# Patient Record
Sex: Male | Born: 1975 | Race: White | Hispanic: No | Smoking: Former smoker
Health system: Southern US, Community
[De-identification: ages and names within clinical notes are randomized; demographics above are authoritative.]

---

## 2016-05-11 DIAGNOSIS — R0683 Snoring: Secondary | ICD-10-CM | POA: Diagnosis not present

## 2016-05-11 DIAGNOSIS — R5382 Chronic fatigue, unspecified: Secondary | ICD-10-CM | POA: Diagnosis not present

## 2016-05-11 DIAGNOSIS — R61 Generalized hyperhidrosis: Secondary | ICD-10-CM | POA: Diagnosis not present

## 2016-05-11 DIAGNOSIS — M791 Myalgia: Secondary | ICD-10-CM | POA: Diagnosis not present

## 2016-07-12 DIAGNOSIS — G4733 Obstructive sleep apnea (adult) (pediatric): Secondary | ICD-10-CM | POA: Diagnosis not present

## 2016-07-13 DIAGNOSIS — G4733 Obstructive sleep apnea (adult) (pediatric): Secondary | ICD-10-CM | POA: Diagnosis not present

## 2018-05-15 ENCOUNTER — Other Ambulatory Visit: Payer: Self-pay

## 2018-05-15 ENCOUNTER — Other Ambulatory Visit: Payer: Self-pay | Admitting: Family Medicine

## 2018-05-15 ENCOUNTER — Ambulatory Visit
Admission: RE | Admit: 2018-05-15 | Discharge: 2018-05-15 | Disposition: A | Discharge status: Home / Self Care | Payer: 59 | Source: Ambulatory Visit | Attending: Family Medicine | Admitting: Family Medicine

## 2018-05-15 DIAGNOSIS — R0781 Pleurodynia: Secondary | ICD-10-CM

## 2018-08-16 ENCOUNTER — Encounter: Payer: Self-pay | Admitting: Family Medicine

## 2018-10-02 ENCOUNTER — Emergency Department (HOSPITAL_COMMUNITY): Payer: 59

## 2018-10-02 ENCOUNTER — Emergency Department (HOSPITAL_COMMUNITY)
Admission: EM | Admit: 2018-10-02 | Discharge: 2018-10-02 | Disposition: A | Discharge status: Home / Self Care | Payer: 59 | Attending: Emergency Medicine | Admitting: Emergency Medicine

## 2018-10-02 ENCOUNTER — Encounter (HOSPITAL_COMMUNITY): Payer: Self-pay | Admitting: Emergency Medicine

## 2018-10-02 ENCOUNTER — Other Ambulatory Visit: Payer: Self-pay

## 2018-10-02 DIAGNOSIS — N200 Calculus of kidney: Secondary | ICD-10-CM | POA: Insufficient documentation

## 2018-10-02 DIAGNOSIS — R1031 Right lower quadrant pain: Secondary | ICD-10-CM | POA: Diagnosis present

## 2018-10-02 DIAGNOSIS — Z79899 Other long term (current) drug therapy: Secondary | ICD-10-CM | POA: Insufficient documentation

## 2018-10-02 LAB — URINALYSIS, ROUTINE W REFLEX MICROSCOPIC
Bacteria, UA: NONE SEEN
Bilirubin Urine: NEGATIVE
Glucose, UA: NEGATIVE mg/dL
Ketones, ur: 20 mg/dL — AB
Leukocytes,Ua: NEGATIVE
Nitrite: NEGATIVE
Protein, ur: 30 mg/dL — AB
Specific Gravity, Urine: 1.024 (ref 1.005–1.030)
pH: 8 (ref 5.0–8.0)

## 2018-10-02 LAB — CBC
HCT: 45 % (ref 39.0–52.0)
Hemoglobin: 15.2 g/dL (ref 13.0–17.0)
MCH: 29.8 pg (ref 26.0–34.0)
MCHC: 33.8 g/dL (ref 30.0–36.0)
MCV: 88.2 fL (ref 80.0–100.0)
Platelets: 229 10*3/uL (ref 150–400)
RBC: 5.1 MIL/uL (ref 4.22–5.81)
RDW: 12.6 % (ref 11.5–15.5)
WBC: 12 10*3/uL — ABNORMAL HIGH (ref 4.0–10.5)
nRBC: 0 % (ref 0.0–0.2)

## 2018-10-02 LAB — COMPREHENSIVE METABOLIC PANEL
ALT: 37 U/L (ref 0–44)
AST: 29 U/L (ref 15–41)
Albumin: 5 g/dL (ref 3.5–5.0)
Alkaline Phosphatase: 53 U/L (ref 38–126)
Anion gap: 13 (ref 5–15)
BUN: 15 mg/dL (ref 6–20)
CO2: 24 mmol/L (ref 22–32)
Calcium: 9.7 mg/dL (ref 8.9–10.3)
Chloride: 102 mmol/L (ref 98–111)
Creatinine, Ser: 0.99 mg/dL (ref 0.61–1.24)
GFR calc Af Amer: >60 mL/min (ref >60)
GFR calc non Af Amer: >60 mL/min (ref >60)
Glucose, Bld: 119 mg/dL — ABNORMAL HIGH (ref 70–99)
Potassium: 3.9 mmol/L (ref 3.5–5.1)
Sodium: 139 mmol/L (ref 135–145)
Total Bilirubin: 0.8 mg/dL (ref 0.3–1.2)
Total Protein: 7.8 g/dL (ref 6.5–8.1)

## 2018-10-02 LAB — LIPASE, BLOOD: Lipase: 30 U/L (ref 11–51)

## 2018-10-02 MED ORDER — KETOROLAC TROMETHAMINE 30 MG/ML IJ SOLN
30.0000 mg | Freq: Once | INTRAMUSCULAR | Status: AC
  Administered 2018-10-02: 20:00:00 30 mg via INTRAVENOUS
  Filled 2018-10-02: qty 1

## 2018-10-02 MED ORDER — OXYCODONE-ACETAMINOPHEN 5-325 MG PO TABS: 1.0000 | ORAL_TABLET | ORAL | 0 refills | Status: AC | PRN

## 2018-10-02 MED ORDER — SODIUM CHLORIDE (PF) 0.9 % IJ SOLN
INTRAMUSCULAR | Status: AC
  Filled 2018-10-02: qty 50

## 2018-10-02 MED ORDER — MORPHINE SULFATE (PF) 4 MG/ML IV SOLN
4.0000 mg | Freq: Once | INTRAVENOUS | Status: DC
  Filled 2018-10-02: qty 1

## 2018-10-02 MED ORDER — IOHEXOL 300 MG/ML  SOLN
100.0000 mL | Freq: Once | INTRAMUSCULAR | Status: AC | PRN
  Administered 2018-10-02: 19:00:00 100 mL via INTRAVENOUS

## 2018-10-02 MED ORDER — SODIUM CHLORIDE 0.9 % IV BOLUS
1000.0000 mL | Freq: Once | INTRAVENOUS | Status: AC
  Administered 2018-10-02: 20:00:00 1000 mL via INTRAVENOUS

## 2018-10-02 MED ORDER — ONDANSETRON HCL 4 MG/2ML IJ SOLN
4.0000 mg | Freq: Once | INTRAMUSCULAR | Status: DC
  Filled 2018-10-02: qty 2

## 2018-10-02 NOTE — ED Triage Notes (Signed)
Per pt, states lower right back pain for a couple of days-states intense RLQ pain that started 45 minutes ago-patient thinks it is his appendix

## 2018-10-02 NOTE — Discharge Instructions (Signed)
We discussed the results of your CT imaging.  Your urine analysis was negative.  I prescribed a short course of pain medication to help with your pain, please take for severe pain.  Please follow up with PCP for further evaluation.

## 2018-10-02 NOTE — ED Provider Notes (Signed)
[facility]-EMERGENCY DEPT Provider Note   CSN: [id number] Arrival date & time: 10/02/18  1701     History   Chief Complaint Chief Complaint  Patient presents with  . Abdominal Pain    HPI Victor Morrow is a 43 y.o. male.     43 y.o male with a PMH of HTN not currently on medication due to weight loss presents to the ED with a chief complaint of abdominal pain x 1 hour.  Patient reports he began experiencing sudden onset of right lower quadrant pain which she describes as an intermittent "I feel like I am being shot in the stomach ", states his pain came on suddenly which has also caused him to be short of breath, and nauseated.  Patient also reports he has not had anything to eat since last night, reports some anorexia.  He denies any vomiting, diarrhea, urinary symptoms, testicular swelling.  Denies any fevers, chest pain, shortness of breath.  Of note, patient had traveled to [place] this past week via car to visit his mother.  The history is provided by the patient and medical records.  Abdominal Pain Pain location:  RLQ Pain quality: shooting and stabbing   Associated symptoms: nausea   Associated symptoms: no chest pain, no chills, no cough, no diarrhea, no dysuria, no fever, no hematuria, no shortness of breath, no sore throat and no vomiting     History reviewed. No pertinent past medical history.  There are no active problems to display for this patient.   History reviewed. No pertinent surgical history.      Home Medications    Prior to Admission medications   Medication Sig Start Date End Date Taking? Authorizing Provider  APPLE CIDER VINEGAR PO Take 1 Dose by mouth daily.    Yes [provider]  ibuprofen (ADVIL) 200 MG tablet Take 600 mg by mouth every 6 (six) hours as needed for moderate pain.   Yes [provider]  Multiple Vitamin (MULTIVITAMIN WITH MINERALS) TABS tablet Take 1 tablet by mouth daily.   Yes  [provider]  oxyCODONE-acetaminophen (PERCOCET/ROXICET) 5-325 MG tablet Take 1 tablet by mouth every 4 (four) hours as needed for up to 3 days for severe pain. 10/02/18 10/05/18  Claude Manges, PA-C    Family History No family history on file.  Social History Social History   Tobacco Use  . Smoking status: Not on file  Substance Use Topics  . Alcohol use: Not on file  . Drug use: Not on file     Allergies   Patient has no known allergies.   Review of Systems Review of Systems  Constitutional: Negative for chills and fever.  HENT: Negative for ear pain and sore throat.   Eyes: Negative for pain and visual disturbance.  Respiratory: Negative for cough and shortness of breath.   Cardiovascular: Negative for chest pain and palpitations.  Gastrointestinal: Positive for abdominal pain and nausea. Negative for diarrhea and vomiting.  Genitourinary: Negative for dysuria, flank pain, hematuria, penile swelling, scrotal swelling and testicular pain.  Musculoskeletal: Negative for arthralgias and back pain.  Skin: Negative for color change and rash.  Neurological: Negative for seizures and syncope.  All other systems reviewed and are negative.    Physical Exam Updated Vital Signs BP (!) 161/105 (BP Location: Right Arm)   Pulse 94   Temp 97.7 F (36.5 C) (Oral)   Resp 16   Ht 6\' 4"  (1.93 m)   Wt 105.7  kg   SpO2 100%   BMI 28.36 kg/m   Physical Exam Vitals signs and nursing note reviewed.  Constitutional:      General: He is in acute distress.     Appearance: He is well-developed. He is ill-appearing.  HENT:     Head: Normocephalic and atraumatic.  Cardiovascular:     Rate and Rhythm: Normal rate and regular rhythm.  Pulmonary:     Effort: Pulmonary effort is normal.     Breath sounds: Normal breath sounds. No wheezing, rhonchi or rales.  Abdominal:     General: Bowel sounds are absent. There is no distension or abdominal bruit. There are no signs of  injury.     Tenderness: There is abdominal tenderness in the right lower quadrant and suprapubic area. There is guarding. There is no right CVA tenderness, left CVA tenderness or rebound.     Hernia: No hernia is present.  Skin:    General: Skin is warm and dry.  Neurological:     Mental Status: He is alert and oriented to person, place, and time.      ED Treatments / Results  Labs (all labs ordered are listed, but only abnormal results are displayed) Labs Reviewed  COMPREHENSIVE METABOLIC PANEL - Abnormal; Notable for the following components:      Result Value   Glucose, Bld 119 (*)    All other components within normal limits  CBC - Abnormal; Notable for the following components:   WBC 12.0 (*)    All other components within normal limits  URINALYSIS, ROUTINE W REFLEX MICROSCOPIC - Abnormal; Notable for the following components:   Hgb urine dipstick SMALL (*)    Ketones, ur 20 (*)    Protein, ur 30 (*)    All other components within normal limits  LIPASE, BLOOD    EKG None  Radiology Ct Abdomen Pelvis W Contrast  Result Date: 10/02/2018 CLINICAL DATA:  43 year old male with abdominal pain. Concern for acute appendicitis. EXAM: CT ABDOMEN AND PELVIS WITH CONTRAST TECHNIQUE: Multidetector CT imaging of the abdomen and pelvis was performed using the standard protocol following bolus administration of intravenous contrast. CONTRAST:  100mL OMNIPAQUE IOHEXOL 300 MG/ML  SOLN COMPARISON:  None. FINDINGS: Lower chest: The visualized lung bases are clear. No intra-abdominal free air or free fluid. Hepatobiliary: No focal liver abnormality is seen. No gallstones, gallbladder wall thickening, or biliary dilatation. Pancreas: Unremarkable. No pancreatic ductal dilatation or surrounding inflammatory changes. Spleen: Normal in size without focal abnormality. Adrenals/Urinary Tract: The adrenal glands are unremarkable. There is a punctate stone in the distal right ureter close to the  ureterovesical junction (series 5, image 94 and axial series 2, image 75). There is mild fullness of the right renal collecting system and haziness of the right perinephric and periureteric fat. The left kidney is unremarkable. Slight delayed enhancement of the right renal parenchyma. The visualized ureters and urinary bladder appear unremarkable. Stomach/Bowel: There is sigmoid diverticulosis without active inflammatory changes. There is no bowel obstruction or active inflammation. The appendix is normal. Vascular/Lymphatic: The abdominal aorta and IVC are unremarkable. No portal venous gas. There is no adenopathy. Reproductive: The prostate and seminal vesicles are grossly unremarkable. No pelvic mass Other: None Musculoskeletal: No acute or significant osseous findings. IMPRESSION: 1. Punctate distal right ureteral/right UVJ calculus with mild right hydronephrosis. Correlation with urinalysis recommended to exclude superimposed UTI. 2. Sigmoid diverticulosis. No bowel obstruction or active inflammation. Normal appendix. Electronically Signed   By: Ceasar MonsArash  Radparvar M.D.  On: 10/02/2018 19:32    Procedures Procedures (including critical care time)  Medications Ordered in ED Medications  ondansetron (ZOFRAN) injection 4 mg (4 mg Intravenous Not Given 10/02/18 2021)  sodium chloride (PF) 0.9 % injection (has no administration in time range)  sodium chloride 0.9 % bolus 1,000 mL (1,000 mLs Intravenous New Bag/Given 10/02/18 1933)  iohexol (OMNIPAQUE) 300 MG/ML solution 100 mL (100 mLs Intravenous Contrast Given 10/02/18 1908)  ketorolac (TORADOL) 30 MG/ML injection 30 mg (30 mg Intravenous Given 10/02/18 2020)     Initial Impression / Assessment and Plan / ED Course  I have reviewed the triage vital signs and the nursing notes.  Pertinent labs & imaging results that were available during my care of the patient were reviewed by me and considered in my medical decision making (see chart for details).     Patient with no past medical history presents the ED with sudden onset of right lower quadrant pain which began an hour ago.  Described as a shooting sensation located on his right side.  He also endorses anorexia, nausea, has not had any vomiting.  He reports his pain is intermittent and once it arrives it makes him short of breath.  He has not taken any medication for improvement in symptoms.  Had a regular bowel movement yesterday, no blood in his stool.  Recently traveled out of state via car, denies any sick contacts.  He denies any fevers. He denies any testicular swelling, testicular pain, urinary symptoms such as dysuria, hematuria.   During primary evaluation patient appears uncomfortable, he reports laying on his left side makes his pain better along with holding of his right lower quadrant.  He is afebrile on arrival, vital signs are within normal limits aside from slight elevated blood pressure suspect this is due to pain.  Differential diagnosis included but not limited to appendicitis, nephrolithiasis, testicular torsion.  We will provide patient with a liter of fluids, lab screening, Zofran along with morphine to help with his symptoms along with CT evaluation. CT abdomen showed: 1. Punctate distal right ureteral/right UVJ calculus with mild right  hydronephrosis. Correlation with urinalysis recommended to exclude  superimposed UTI.  2. Sigmoid diverticulosis. No bowel obstruction or active  inflammation. Normal appendix.     These results were discussed with patient, UA did not show any white blood cell count, leukocytes, nitrites. Doubt infected stone.  Will now treat patient symptomatically with some pain control while at home.  He was offered morphine for pain but he declined this, gave him Toradol instead which he tolerated well.  Patient is otherwise afebrile, ambulatory with a steady gait.  Discussed strict return precautions with patient.  Patient stable for discharge.   Portions of this note were generated with [employer]. Dictation errors may occur despite best attempts at proofreading.  Final Clinical Impressions(s) / ED Diagnoses   Final diagnoses:  Right lower quadrant abdominal pain  Kidney stone on right side    ED Discharge Orders         Ordered    oxyCODONE-acetaminophen (PERCOCET/ROXICET) 5-325 MG tablet  Every 4 hours PRN     10/02/18 2100           Janeece Fitting, PA-C 10/02/18 2113    Virgel Manifold, MD 10/06/18 1245

## 2018-12-12 ENCOUNTER — Ambulatory Visit: Payer: Self-pay | Admitting: Podiatry

## 2018-12-13 ENCOUNTER — Ambulatory Visit (INDEPENDENT_AMBULATORY_CARE_PROVIDER_SITE_OTHER): Payer: 59

## 2018-12-13 ENCOUNTER — Other Ambulatory Visit: Payer: Self-pay

## 2018-12-13 ENCOUNTER — Encounter: Payer: Self-pay | Admitting: Podiatry

## 2018-12-13 ENCOUNTER — Ambulatory Visit: Payer: 59 | Admitting: Podiatry

## 2018-12-13 VITALS — BP 126/89

## 2018-12-13 DIAGNOSIS — M2142 Flat foot [pes planus] (acquired), left foot: Secondary | ICD-10-CM

## 2018-12-13 DIAGNOSIS — M2141 Flat foot [pes planus] (acquired), right foot: Secondary | ICD-10-CM

## 2018-12-13 DIAGNOSIS — M21621 Bunionette of right foot: Secondary | ICD-10-CM | POA: Diagnosis not present

## 2018-12-13 DIAGNOSIS — M205X1 Other deformities of toe(s) (acquired), right foot: Secondary | ICD-10-CM | POA: Diagnosis not present

## 2018-12-17 ENCOUNTER — Encounter: Payer: Self-pay | Admitting: Podiatry

## 2018-12-17 NOTE — Progress Notes (Signed)
Subjective:  Patient ID: Victor Morrow, male    DOB: [birth year],  MRN: [id number]  Chief Complaint  Patient presents with  . Foot Pain    pt is here for pes planus bil, pt is also here possible callouses as well    43 y.o. male presents with the above complaint.  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  Patient states that they have been painful for over a year now he has tried over-the-counter orthotics but has not helped.  Patient states he is also has corns and calluses around the tailor's bunion from excessive pressure from shoe gear.  He has not tried any other shoe gear modification.  He has not seek any treatment from any other podiatrist.  He denies any other acute complaints.   Review of Systems: Negative except as noted in the HPI. Denies N/V/F/Ch.  No past medical history on file.  Current Outpatient Medications:  .  APPLE CIDER VINEGAR PO, Take 1 Dose by mouth daily. , Disp: , Rfl:  .  ibuprofen (ADVIL) 200 MG tablet, Take 600 mg by mouth every 6 (six) hours as needed for moderate pain., Disp: , Rfl:  .  Multiple Vitamin (MULTIVITAMIN WITH MINERALS) TABS tablet, Take 1 tablet by mouth daily., Disp: , Rfl:   Social History   Tobacco Use  Smoking Status Former Smoker  Smokeless Tobacco Former User    No Known Allergies Objective:   Vitals:   12/13/18 0852  BP: 126/89   There is no height or weight on file to calculate BMI. Constitutional Well developed. Well nourished.  Vascular Dorsalis pedis pulses palpable bilaterally. Posterior tibial pulses palpable bilaterally. Capillary refill normal to all digits.  No cyanosis or clubbing noted. Pedal hair growth normal.  Neurologic Normal speech. Oriented to person, place, and time. Epicritic sensation to light touch grossly present bilaterally.  Dermatologic Nails well  groomed and normal in appearance. No open wounds. No skin lesions.  Orthopedic:  Pain on palpation to the right fifth metatarsal phalangeal joint.  There is a hyperkeratotic lesion noted at the fifth metatarsal head.  Pain on palpation to the hyperkeratotic lesion.  There is an adductovarus of the fifth digit noted.  No callus formation on the fourth digit.    Radiographs: 2 views of skeletally mature adult bilateral feet: There is slight increase in the IM angle with the right being slightly more than the left.  There is an increase in lateral deviation angle bilaterally.  There is a increase in soft tissue density and volume around the fifth metatarsophalangeal joint.  There is a adductovarus deformity noted to bilateral fifth digit. Assessment:   1. Pes planus of both feet    Plan:  Patient was evaluated and treated and all questions answered.  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  I explained to the patient that debridement of the hyperkeratotic lesion is the best way to keep it at [place] conservatively. -I also explained to the patient surgical intervention to fix the tailor's bunion as well as the hammertoe/adductovarus component of the fifth digit.  This in theory will reduce the pressure on the outside of the fifth metatarsal head and therefore reducing pain.  Patient states that  he has tried and exhausted all the over-the-counter therapy as well as shoe gear modification.  Patient wishes that he would like to have a surgical intervention however he would like to discuss it further after he checks with the insurance company. -I explained to the patient to come see me back when he is ready to undergo surgery to help realign the fifth digit as well as address the tailor's bunion. -Patient is scheduled to see rick  for orthotics to help with addressing the hindfoot motion and therefore improving the gait.  I believe this will also help in the reduction of pain.  Return in about 3 weeks (around 01/03/2019).

## 2019-01-08 ENCOUNTER — Ambulatory Visit: Payer: 59 | Admitting: Podiatry

## 2019-01-09 ENCOUNTER — Other Ambulatory Visit: Payer: 59 | Admitting: Orthotics

## 2019-01-13 ENCOUNTER — Other Ambulatory Visit: Payer: Self-pay

## 2019-01-13 ENCOUNTER — Ambulatory Visit: Payer: 59 | Admitting: Orthotics

## 2019-01-13 DIAGNOSIS — M2141 Flat foot [pes planus] (acquired), right foot: Secondary | ICD-10-CM

## 2019-01-13 DIAGNOSIS — M21621 Bunionette of right foot: Secondary | ICD-10-CM

## 2019-01-13 DIAGNOSIS — M205X1 Other deformities of toe(s) (acquired), right foot: Secondary | ICD-10-CM

## 2019-01-13 NOTE — Progress Notes (Signed)
Patient decided to pass on f/o at this time to address tailor bunion issues; he really needs a wider shoe to address the pain on lateral aspect of 5th toe.

## 2020-11-25 DIAGNOSIS — I10 Essential (primary) hypertension: Secondary | ICD-10-CM | POA: Diagnosis not present

## 2020-11-25 DIAGNOSIS — Z23 Encounter for immunization: Secondary | ICD-10-CM | POA: Diagnosis not present

## 2020-11-25 DIAGNOSIS — F419 Anxiety disorder, unspecified: Secondary | ICD-10-CM | POA: Diagnosis not present

## 2020-11-25 DIAGNOSIS — Z125 Encounter for screening for malignant neoplasm of prostate: Secondary | ICD-10-CM | POA: Diagnosis not present

## 2020-11-25 DIAGNOSIS — Z1211 Encounter for screening for malignant neoplasm of colon: Secondary | ICD-10-CM | POA: Diagnosis not present

## 2021-01-12 IMAGING — CT CT ABD-PELV W/ CM
2 of 5 series · 16 of 46 positions shown, 18 images · IV contrast (omnipaque)
Comparison: None.

CLINICAL DATA: 43-year-old male with abdominal pain. Concern for
acute appendicitis.

EXAM:
CT ABDOMEN AND PELVIS WITH CONTRAST
TECHNIQUE: Multidetector CT imaging of the abdomen and pelvis was performed
using the standard protocol following bolus administration of
intravenous contrast.
CONTRAST:  100mL OMNIPAQUE IOHEXOL 300 MG/ML  SOLN

[Series 2: axial st · axial · 0.88mm/px · z∈[-302,+118]mm · 13 of 98 slices shown, 15 images]
[im 7/98  soft-tissue]
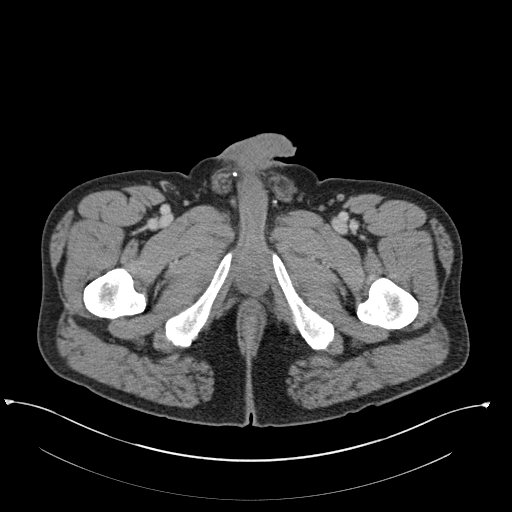
[im 7/98  bone]
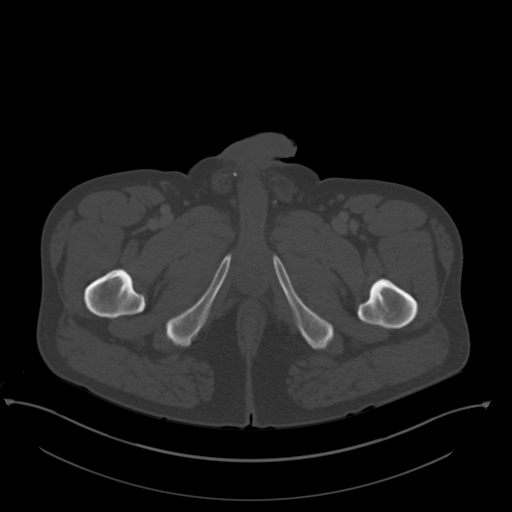
[im 13/98  soft-tissue]
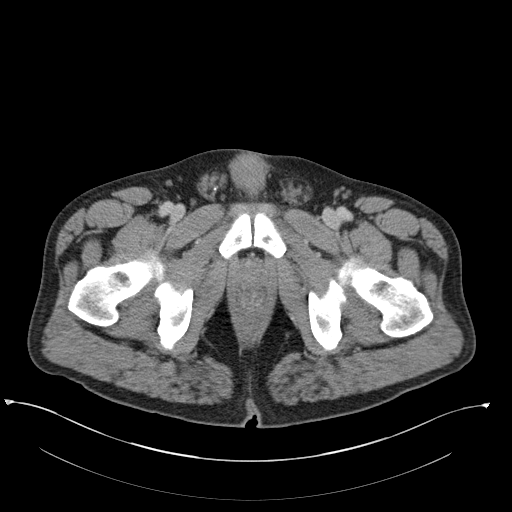
[im 20/98  soft-tissue]
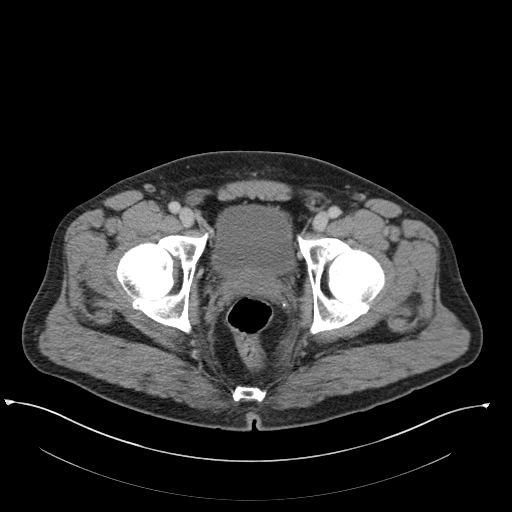
[im 26/98  soft-tissue]
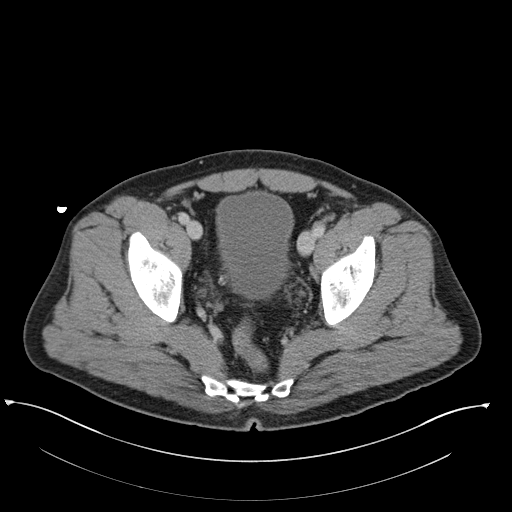
[im 33/98  soft-tissue]
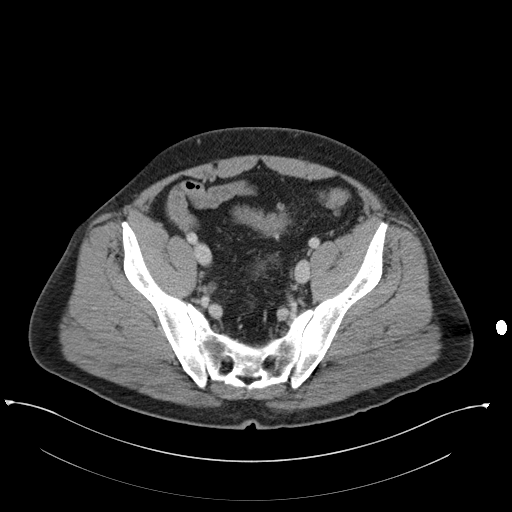
[im 39/98  soft-tissue]
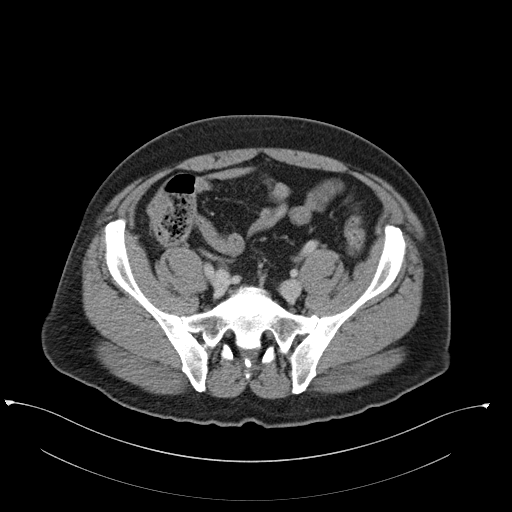
[im 52/98  soft-tissue]
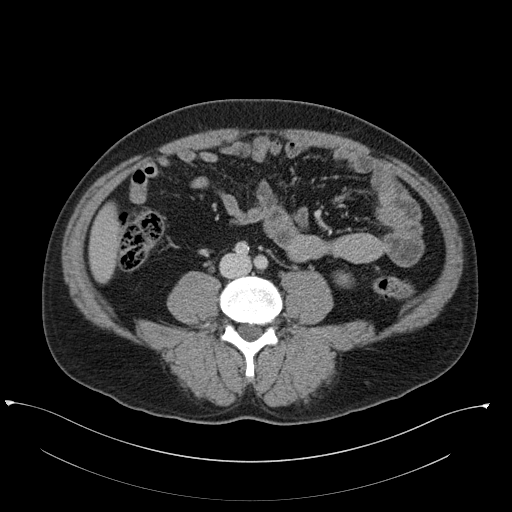
[im 59/98  soft-tissue]
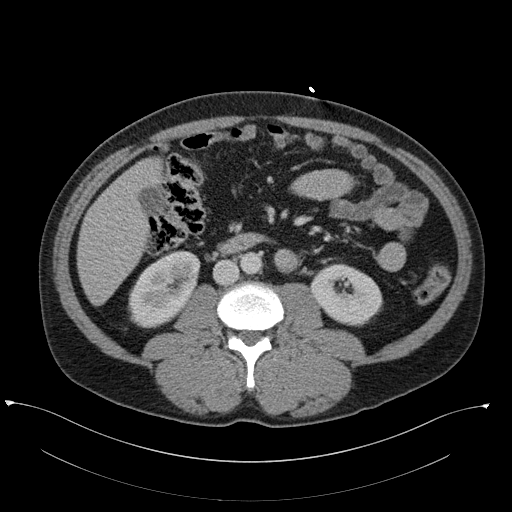
[im 65/98  soft-tissue]
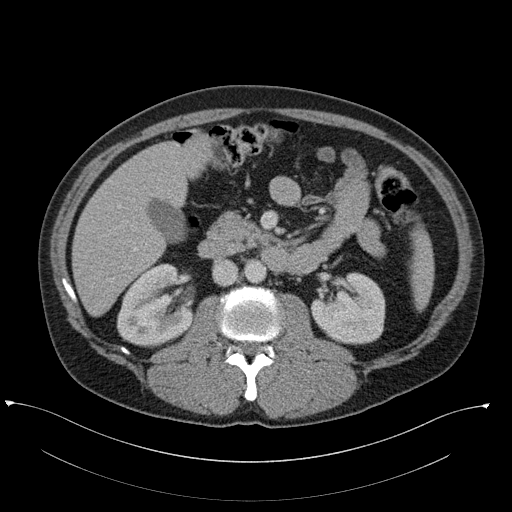
[im 65/98  bone]
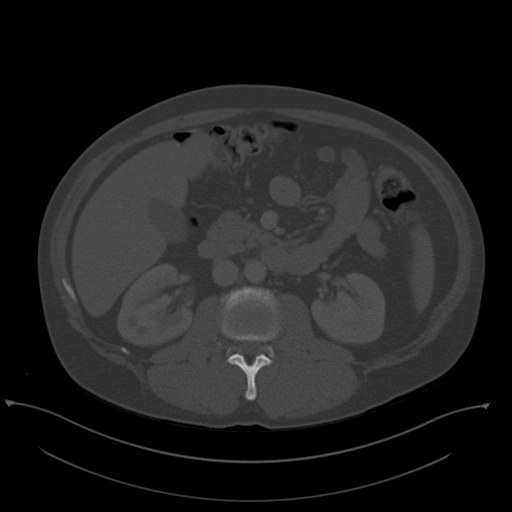
[im 72/98  soft-tissue]
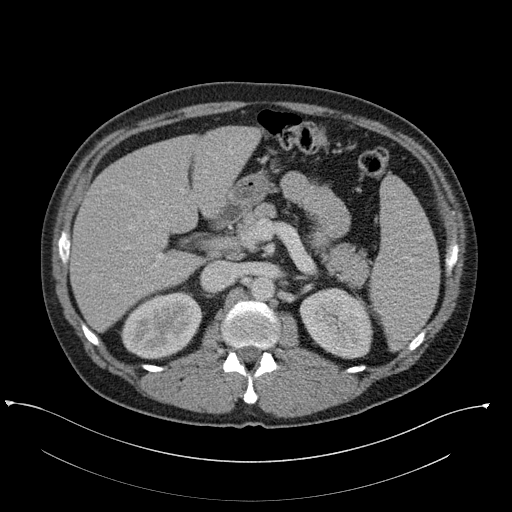
[im 78/98  soft-tissue]
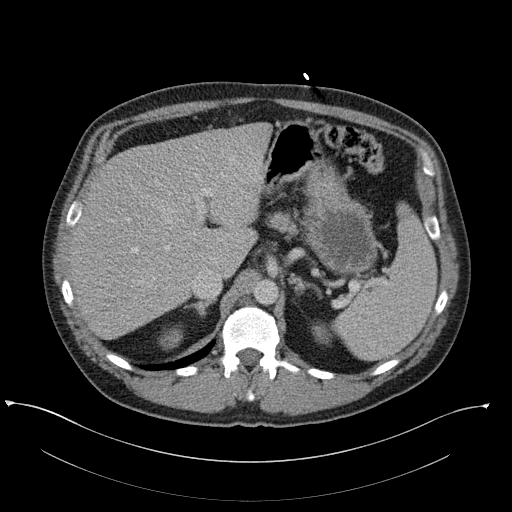
[im 85/98  soft-tissue]
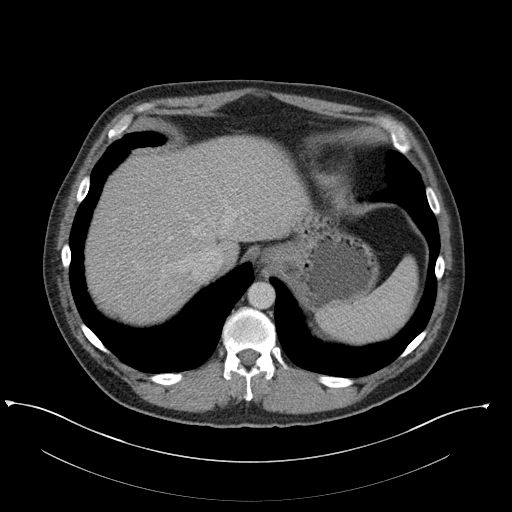
[im 91/98  soft-tissue]
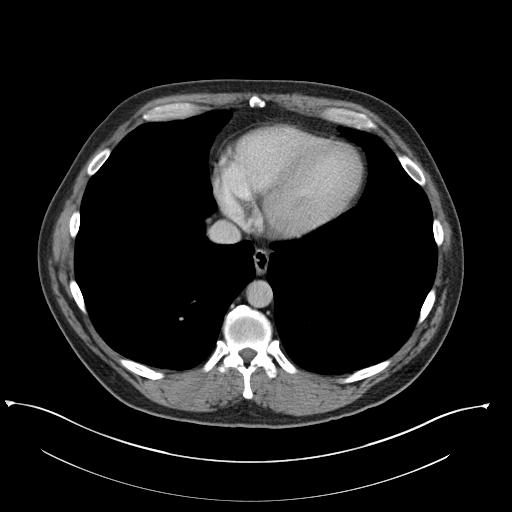

[Series 5: coronal st · coronal · 0.81mm/px · 3 of 158 slices shown]
[im 53/158  soft-tissue]
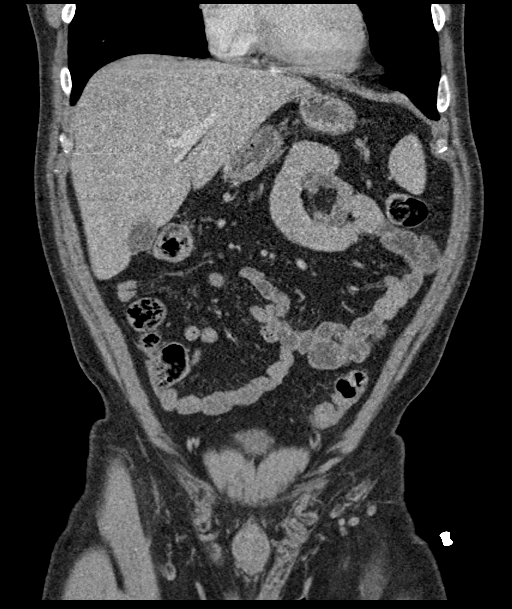
[im 70/158  soft-tissue]
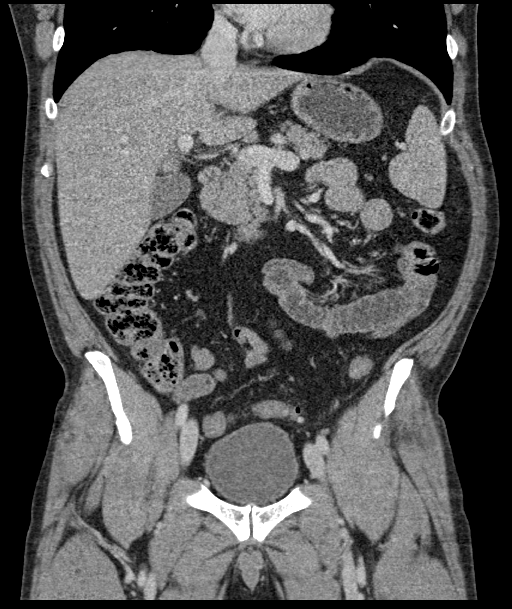
[im 88/158  soft-tissue]
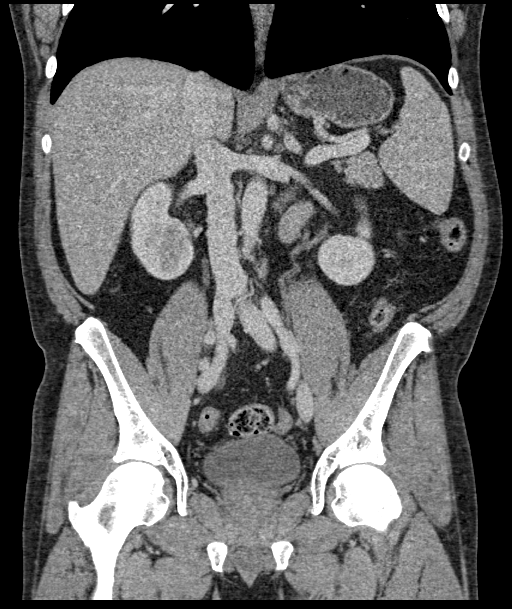

[16 of 46 positions shown; findings below may reference images not displayed]

FINDINGS: Lower chest: The visualized lung bases are clear.

No intra-abdominal free air or free fluid.

Hepatobiliary: No focal liver abnormality is seen. No gallstones,
gallbladder wall thickening, or biliary dilatation.

Pancreas: Unremarkable. No pancreatic ductal dilatation or
surrounding inflammatory changes.

Spleen: Normal in size without focal abnormality.

Adrenals/Urinary Tract: The adrenal glands are unremarkable. There
is a punctate stone in the distal right ureter close to the
ureterovesical junction (series 5, image 94 and axial series 2,
image 75). There is mild fullness of the right renal collecting
system and haziness of the right perinephric and periureteric fat.
The left kidney is unremarkable. Slight delayed enhancement of the
right renal parenchyma. The visualized ureters and urinary bladder
appear unremarkable.

Stomach/Bowel: There is sigmoid diverticulosis without active
inflammatory changes. There is no bowel obstruction or active
inflammation. The appendix is normal.

Vascular/Lymphatic: The abdominal aorta and IVC are unremarkable. No
portal venous gas. There is no adenopathy.

Reproductive: The prostate and seminal vesicles are grossly
unremarkable. No pelvic mass

Other: None

Musculoskeletal: No acute or significant osseous findings.
IMPRESSION: 1. Punctate distal right ureteral/right UVJ calculus with mild right
hydronephrosis. Correlation with urinalysis recommended to exclude
superimposed UTI.
2. Sigmoid diverticulosis. No bowel obstruction or active
inflammation. Normal appendix.

## 2021-02-02 DIAGNOSIS — Z79899 Other long term (current) drug therapy: Secondary | ICD-10-CM | POA: Diagnosis not present

## 2021-02-02 DIAGNOSIS — E785 Hyperlipidemia, unspecified: Secondary | ICD-10-CM | POA: Diagnosis not present

## 2021-02-14 DIAGNOSIS — U071 COVID-19: Secondary | ICD-10-CM | POA: Diagnosis not present

## 2021-02-14 DIAGNOSIS — K7581 Nonalcoholic steatohepatitis (NASH): Secondary | ICD-10-CM | POA: Diagnosis not present

## 2021-02-14 DIAGNOSIS — I10 Essential (primary) hypertension: Secondary | ICD-10-CM | POA: Diagnosis not present

## 2021-03-28 DIAGNOSIS — R7401 Elevation of levels of liver transaminase levels: Secondary | ICD-10-CM | POA: Diagnosis not present

## 2021-05-25 DIAGNOSIS — F419 Anxiety disorder, unspecified: Secondary | ICD-10-CM | POA: Diagnosis not present

## 2021-05-25 DIAGNOSIS — R7401 Elevation of levels of liver transaminase levels: Secondary | ICD-10-CM | POA: Diagnosis not present

## 2021-05-25 DIAGNOSIS — E785 Hyperlipidemia, unspecified: Secondary | ICD-10-CM | POA: Diagnosis not present

## 2021-05-25 DIAGNOSIS — I10 Essential (primary) hypertension: Secondary | ICD-10-CM | POA: Diagnosis not present

## 2021-08-05 DIAGNOSIS — K5730 Diverticulosis of large intestine without perforation or abscess without bleeding: Secondary | ICD-10-CM | POA: Diagnosis not present

## 2021-08-05 DIAGNOSIS — K648 Other hemorrhoids: Secondary | ICD-10-CM | POA: Diagnosis not present

## 2021-08-05 DIAGNOSIS — Z1211 Encounter for screening for malignant neoplasm of colon: Secondary | ICD-10-CM | POA: Diagnosis not present

## 2021-08-05 DIAGNOSIS — D120 Benign neoplasm of cecum: Secondary | ICD-10-CM | POA: Diagnosis not present

## 2021-08-05 DIAGNOSIS — D123 Benign neoplasm of transverse colon: Secondary | ICD-10-CM | POA: Diagnosis not present

## 2021-12-01 DIAGNOSIS — E785 Hyperlipidemia, unspecified: Secondary | ICD-10-CM | POA: Diagnosis not present

## 2021-12-01 DIAGNOSIS — Z23 Encounter for immunization: Secondary | ICD-10-CM | POA: Diagnosis not present

## 2021-12-01 DIAGNOSIS — K7581 Nonalcoholic steatohepatitis (NASH): Secondary | ICD-10-CM | POA: Diagnosis not present

## 2021-12-01 DIAGNOSIS — F419 Anxiety disorder, unspecified: Secondary | ICD-10-CM | POA: Diagnosis not present

## 2021-12-01 DIAGNOSIS — Z125 Encounter for screening for malignant neoplasm of prostate: Secondary | ICD-10-CM | POA: Diagnosis not present

## 2021-12-01 DIAGNOSIS — I10 Essential (primary) hypertension: Secondary | ICD-10-CM | POA: Diagnosis not present

## 2021-12-08 DIAGNOSIS — F331 Major depressive disorder, recurrent, moderate: Secondary | ICD-10-CM | POA: Diagnosis not present

## 2021-12-22 DIAGNOSIS — F331 Major depressive disorder, recurrent, moderate: Secondary | ICD-10-CM | POA: Diagnosis not present

## 2022-01-04 DIAGNOSIS — F331 Major depressive disorder, recurrent, moderate: Secondary | ICD-10-CM | POA: Diagnosis not present

## 2022-01-09 DIAGNOSIS — I10 Essential (primary) hypertension: Secondary | ICD-10-CM | POA: Diagnosis not present

## 2022-01-09 DIAGNOSIS — F419 Anxiety disorder, unspecified: Secondary | ICD-10-CM | POA: Diagnosis not present

## 2022-01-11 DIAGNOSIS — F331 Major depressive disorder, recurrent, moderate: Secondary | ICD-10-CM | POA: Diagnosis not present

## 2022-01-26 DIAGNOSIS — F331 Major depressive disorder, recurrent, moderate: Secondary | ICD-10-CM | POA: Diagnosis not present

## 2022-02-08 DIAGNOSIS — F331 Major depressive disorder, recurrent, moderate: Secondary | ICD-10-CM | POA: Diagnosis not present

## 2022-02-14 DIAGNOSIS — F331 Major depressive disorder, recurrent, moderate: Secondary | ICD-10-CM | POA: Diagnosis not present

## 2022-02-21 DIAGNOSIS — F331 Major depressive disorder, recurrent, moderate: Secondary | ICD-10-CM | POA: Diagnosis not present

## 2022-02-28 DIAGNOSIS — F331 Major depressive disorder, recurrent, moderate: Secondary | ICD-10-CM | POA: Diagnosis not present

## 2022-03-01 DIAGNOSIS — I10 Essential (primary) hypertension: Secondary | ICD-10-CM | POA: Diagnosis not present

## 2022-03-01 DIAGNOSIS — F419 Anxiety disorder, unspecified: Secondary | ICD-10-CM | POA: Diagnosis not present

## 2022-03-14 DIAGNOSIS — F331 Major depressive disorder, recurrent, moderate: Secondary | ICD-10-CM | POA: Diagnosis not present

## 2022-03-21 DIAGNOSIS — F331 Major depressive disorder, recurrent, moderate: Secondary | ICD-10-CM | POA: Diagnosis not present

## 2022-03-28 DIAGNOSIS — F331 Major depressive disorder, recurrent, moderate: Secondary | ICD-10-CM | POA: Diagnosis not present

## 2022-04-05 DIAGNOSIS — F331 Major depressive disorder, recurrent, moderate: Secondary | ICD-10-CM | POA: Diagnosis not present

## 2022-04-11 DIAGNOSIS — F331 Major depressive disorder, recurrent, moderate: Secondary | ICD-10-CM | POA: Diagnosis not present

## 2022-04-19 DIAGNOSIS — F331 Major depressive disorder, recurrent, moderate: Secondary | ICD-10-CM | POA: Diagnosis not present

## 2022-04-30 ENCOUNTER — Ambulatory Visit: Payer: Self-pay

## 2022-04-30 DIAGNOSIS — J0140 Acute pansinusitis, unspecified: Secondary | ICD-10-CM | POA: Diagnosis not present

## 2022-05-03 DIAGNOSIS — F331 Major depressive disorder, recurrent, moderate: Secondary | ICD-10-CM | POA: Diagnosis not present

## 2022-05-18 DIAGNOSIS — F331 Major depressive disorder, recurrent, moderate: Secondary | ICD-10-CM | POA: Diagnosis not present

## 2022-05-24 DIAGNOSIS — R7401 Elevation of levels of liver transaminase levels: Secondary | ICD-10-CM | POA: Diagnosis not present

## 2022-05-24 DIAGNOSIS — R5383 Other fatigue: Secondary | ICD-10-CM | POA: Diagnosis not present

## 2022-05-24 DIAGNOSIS — K7581 Nonalcoholic steatohepatitis (NASH): Secondary | ICD-10-CM | POA: Diagnosis not present

## 2022-05-24 DIAGNOSIS — N529 Male erectile dysfunction, unspecified: Secondary | ICD-10-CM | POA: Diagnosis not present

## 2022-05-24 DIAGNOSIS — I10 Essential (primary) hypertension: Secondary | ICD-10-CM | POA: Diagnosis not present

## 2022-05-24 DIAGNOSIS — F411 Generalized anxiety disorder: Secondary | ICD-10-CM | POA: Diagnosis not present

## 2022-05-24 DIAGNOSIS — E785 Hyperlipidemia, unspecified: Secondary | ICD-10-CM | POA: Diagnosis not present

## 2022-05-24 DIAGNOSIS — E669 Obesity, unspecified: Secondary | ICD-10-CM | POA: Diagnosis not present

## 2022-05-31 DIAGNOSIS — F331 Major depressive disorder, recurrent, moderate: Secondary | ICD-10-CM | POA: Diagnosis not present

## 2022-06-22 DIAGNOSIS — F331 Major depressive disorder, recurrent, moderate: Secondary | ICD-10-CM | POA: Diagnosis not present

## 2022-07-13 DIAGNOSIS — F331 Major depressive disorder, recurrent, moderate: Secondary | ICD-10-CM | POA: Diagnosis not present

## 2022-08-15 DIAGNOSIS — F331 Major depressive disorder, recurrent, moderate: Secondary | ICD-10-CM | POA: Diagnosis not present

## 2022-08-25 DIAGNOSIS — F331 Major depressive disorder, recurrent, moderate: Secondary | ICD-10-CM | POA: Diagnosis not present

## 2022-09-05 DIAGNOSIS — F331 Major depressive disorder, recurrent, moderate: Secondary | ICD-10-CM | POA: Diagnosis not present

## 2022-09-19 DIAGNOSIS — F331 Major depressive disorder, recurrent, moderate: Secondary | ICD-10-CM | POA: Diagnosis not present

## 2022-10-04 DIAGNOSIS — F331 Major depressive disorder, recurrent, moderate: Secondary | ICD-10-CM | POA: Diagnosis not present

## 2022-10-17 DIAGNOSIS — F331 Major depressive disorder, recurrent, moderate: Secondary | ICD-10-CM | POA: Diagnosis not present

## 2022-10-31 DIAGNOSIS — F331 Major depressive disorder, recurrent, moderate: Secondary | ICD-10-CM | POA: Diagnosis not present

## 2022-11-29 DIAGNOSIS — Z125 Encounter for screening for malignant neoplasm of prostate: Secondary | ICD-10-CM | POA: Diagnosis not present

## 2022-11-29 DIAGNOSIS — I10 Essential (primary) hypertension: Secondary | ICD-10-CM | POA: Diagnosis not present

## 2022-11-29 DIAGNOSIS — F419 Anxiety disorder, unspecified: Secondary | ICD-10-CM | POA: Diagnosis not present

## 2022-11-29 DIAGNOSIS — K7581 Nonalcoholic steatohepatitis (NASH): Secondary | ICD-10-CM | POA: Diagnosis not present

## 2022-11-29 DIAGNOSIS — E785 Hyperlipidemia, unspecified: Secondary | ICD-10-CM | POA: Diagnosis not present

## 2022-12-12 DIAGNOSIS — F331 Major depressive disorder, recurrent, moderate: Secondary | ICD-10-CM | POA: Diagnosis not present

## 2023-01-09 DIAGNOSIS — D492 Neoplasm of unspecified behavior of bone, soft tissue, and skin: Secondary | ICD-10-CM | POA: Diagnosis not present

## 2023-01-09 DIAGNOSIS — L821 Other seborrheic keratosis: Secondary | ICD-10-CM | POA: Diagnosis not present

## 2023-01-09 DIAGNOSIS — D225 Melanocytic nevi of trunk: Secondary | ICD-10-CM | POA: Diagnosis not present

## 2023-01-09 DIAGNOSIS — L814 Other melanin hyperpigmentation: Secondary | ICD-10-CM | POA: Diagnosis not present

## 2023-02-06 DIAGNOSIS — F331 Major depressive disorder, recurrent, moderate: Secondary | ICD-10-CM | POA: Diagnosis not present

## 2023-02-21 DIAGNOSIS — F331 Major depressive disorder, recurrent, moderate: Secondary | ICD-10-CM | POA: Diagnosis not present

## 2023-02-28 DIAGNOSIS — F419 Anxiety disorder, unspecified: Secondary | ICD-10-CM | POA: Diagnosis not present

## 2023-02-28 DIAGNOSIS — E785 Hyperlipidemia, unspecified: Secondary | ICD-10-CM | POA: Diagnosis not present

## 2023-02-28 DIAGNOSIS — E669 Obesity, unspecified: Secondary | ICD-10-CM | POA: Diagnosis not present

## 2023-02-28 DIAGNOSIS — I10 Essential (primary) hypertension: Secondary | ICD-10-CM | POA: Diagnosis not present

## 2023-03-05 DIAGNOSIS — F331 Major depressive disorder, recurrent, moderate: Secondary | ICD-10-CM | POA: Diagnosis not present

## 2023-03-19 DIAGNOSIS — F331 Major depressive disorder, recurrent, moderate: Secondary | ICD-10-CM | POA: Diagnosis not present

## 2023-03-29 DIAGNOSIS — R350 Frequency of micturition: Secondary | ICD-10-CM | POA: Diagnosis not present

## 2023-03-29 DIAGNOSIS — I10 Essential (primary) hypertension: Secondary | ICD-10-CM | POA: Diagnosis not present

## 2023-03-29 DIAGNOSIS — R631 Polydipsia: Secondary | ICD-10-CM | POA: Diagnosis not present

## 2023-03-29 DIAGNOSIS — E1165 Type 2 diabetes mellitus with hyperglycemia: Secondary | ICD-10-CM | POA: Diagnosis not present

## 2023-04-02 DIAGNOSIS — F331 Major depressive disorder, recurrent, moderate: Secondary | ICD-10-CM | POA: Diagnosis not present

## 2023-04-05 DIAGNOSIS — E119 Type 2 diabetes mellitus without complications: Secondary | ICD-10-CM | POA: Diagnosis not present

## 2023-04-05 DIAGNOSIS — I10 Essential (primary) hypertension: Secondary | ICD-10-CM | POA: Diagnosis not present

## 2023-04-05 DIAGNOSIS — Z6831 Body mass index (BMI) 31.0-31.9, adult: Secondary | ICD-10-CM | POA: Diagnosis not present

## 2023-04-05 DIAGNOSIS — E669 Obesity, unspecified: Secondary | ICD-10-CM | POA: Diagnosis not present

## 2023-04-17 DIAGNOSIS — F331 Major depressive disorder, recurrent, moderate: Secondary | ICD-10-CM | POA: Diagnosis not present

## 2023-04-18 DIAGNOSIS — R208 Other disturbances of skin sensation: Secondary | ICD-10-CM | POA: Diagnosis not present

## 2023-04-18 DIAGNOSIS — B078 Other viral warts: Secondary | ICD-10-CM | POA: Diagnosis not present

## 2023-05-03 DIAGNOSIS — E669 Obesity, unspecified: Secondary | ICD-10-CM | POA: Diagnosis not present

## 2023-05-03 DIAGNOSIS — E785 Hyperlipidemia, unspecified: Secondary | ICD-10-CM | POA: Diagnosis not present

## 2023-05-03 DIAGNOSIS — E119 Type 2 diabetes mellitus without complications: Secondary | ICD-10-CM | POA: Diagnosis not present

## 2023-05-03 DIAGNOSIS — F331 Major depressive disorder, recurrent, moderate: Secondary | ICD-10-CM | POA: Diagnosis not present

## 2023-05-03 DIAGNOSIS — I10 Essential (primary) hypertension: Secondary | ICD-10-CM | POA: Diagnosis not present

## 2023-05-17 DIAGNOSIS — F331 Major depressive disorder, recurrent, moderate: Secondary | ICD-10-CM | POA: Diagnosis not present

## 2023-05-28 DIAGNOSIS — I10 Essential (primary) hypertension: Secondary | ICD-10-CM | POA: Diagnosis not present

## 2023-05-28 DIAGNOSIS — E785 Hyperlipidemia, unspecified: Secondary | ICD-10-CM | POA: Diagnosis not present

## 2023-05-28 DIAGNOSIS — F419 Anxiety disorder, unspecified: Secondary | ICD-10-CM | POA: Diagnosis not present

## 2023-05-28 DIAGNOSIS — F331 Major depressive disorder, recurrent, moderate: Secondary | ICD-10-CM | POA: Diagnosis not present

## 2023-05-28 DIAGNOSIS — E119 Type 2 diabetes mellitus without complications: Secondary | ICD-10-CM | POA: Diagnosis not present

## 2023-06-14 DIAGNOSIS — F331 Major depressive disorder, recurrent, moderate: Secondary | ICD-10-CM | POA: Diagnosis not present

## 2023-06-26 DIAGNOSIS — F331 Major depressive disorder, recurrent, moderate: Secondary | ICD-10-CM | POA: Diagnosis not present

## 2023-07-17 DIAGNOSIS — F331 Major depressive disorder, recurrent, moderate: Secondary | ICD-10-CM | POA: Diagnosis not present

## 2023-08-02 DIAGNOSIS — E119 Type 2 diabetes mellitus without complications: Secondary | ICD-10-CM | POA: Diagnosis not present

## 2023-08-02 DIAGNOSIS — I10 Essential (primary) hypertension: Secondary | ICD-10-CM | POA: Diagnosis not present

## 2023-08-02 DIAGNOSIS — F419 Anxiety disorder, unspecified: Secondary | ICD-10-CM | POA: Diagnosis not present

## 2023-08-02 DIAGNOSIS — E785 Hyperlipidemia, unspecified: Secondary | ICD-10-CM | POA: Diagnosis not present

## 2023-08-02 DIAGNOSIS — R002 Palpitations: Secondary | ICD-10-CM | POA: Diagnosis not present

## 2023-08-07 DIAGNOSIS — F331 Major depressive disorder, recurrent, moderate: Secondary | ICD-10-CM | POA: Diagnosis not present

## 2023-08-21 DIAGNOSIS — F331 Major depressive disorder, recurrent, moderate: Secondary | ICD-10-CM | POA: Diagnosis not present

## 2023-09-04 DIAGNOSIS — F331 Major depressive disorder, recurrent, moderate: Secondary | ICD-10-CM | POA: Diagnosis not present

## 2023-09-18 DIAGNOSIS — F331 Major depressive disorder, recurrent, moderate: Secondary | ICD-10-CM | POA: Diagnosis not present
